# Patient Record
Sex: Male | Born: 1980 | Race: White | Hispanic: No | Marital: Single | Smoking: Never smoker
Health system: Southern US, Community
[De-identification: ages and names within clinical notes are randomized; demographics above are authoritative.]

## PROBLEM LIST (undated history)

## (undated) DIAGNOSIS — F419 Anxiety disorder, unspecified: Secondary | ICD-10-CM

---
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
## 2014-10-11 ENCOUNTER — Telehealth: Payer: Self-pay | Admitting: Internal Medicine

## 2014-10-11 NOTE — Telephone Encounter (Signed)
Received records from [facility] for appointment on 12/14/14 with Dr Hilty.  Records given to N Hines (medical records) for Dr Hilty's schedule on 12/14/14. lp °

## 2014-12-13 ENCOUNTER — Emergency Department (HOSPITAL_COMMUNITY): Payer: PRIVATE HEALTH INSURANCE

## 2014-12-13 ENCOUNTER — Encounter (HOSPITAL_COMMUNITY): Payer: Self-pay | Admitting: *Deleted

## 2014-12-13 ENCOUNTER — Emergency Department (HOSPITAL_COMMUNITY)
Admission: EM | Admit: 2014-12-13 | Discharge: 2014-12-13 | Disposition: A | Discharge status: Home / Self Care | Payer: PRIVATE HEALTH INSURANCE | Attending: Emergency Medicine | Admitting: Emergency Medicine

## 2014-12-13 DIAGNOSIS — R42 Dizziness and giddiness: Secondary | ICD-10-CM | POA: Diagnosis not present

## 2014-12-13 DIAGNOSIS — R0602 Shortness of breath: Secondary | ICD-10-CM | POA: Diagnosis not present

## 2014-12-13 DIAGNOSIS — Z79899 Other long term (current) drug therapy: Secondary | ICD-10-CM | POA: Diagnosis not present

## 2014-12-13 DIAGNOSIS — R202 Paresthesia of skin: Secondary | ICD-10-CM | POA: Diagnosis not present

## 2014-12-13 DIAGNOSIS — R079 Chest pain, unspecified: Secondary | ICD-10-CM | POA: Diagnosis present

## 2014-12-13 HISTORY — DX: Anxiety disorder, unspecified: F41.9

## 2014-12-13 LAB — CBC
HEMATOCRIT: 47.8 % (ref 39.0–52.0)
Hemoglobin: 16.4 g/dL (ref 13.0–17.0)
MCH: 28 pg (ref 26.0–34.0)
MCHC: 34.3 g/dL (ref 30.0–36.0)
MCV: 81.7 fL (ref 78.0–100.0)
Platelets: 182 10*3/uL (ref 150–400)
RBC: 5.85 MIL/uL — ABNORMAL HIGH (ref 4.22–5.81)
RDW: 13.6 % (ref 11.5–15.5)
WBC: 6.8 10*3/uL (ref 4.0–10.5)

## 2014-12-13 LAB — BASIC METABOLIC PANEL
ANION GAP: 10 (ref 5–15)
BUN: 14 mg/dL (ref 6–20)
CO2: 23 mmol/L (ref 22–32)
CREATININE: 0.95 mg/dL (ref 0.61–1.24)
Calcium: 9.4 mg/dL (ref 8.9–10.3)
Chloride: 106 mmol/L (ref 101–111)
GFR calc Af Amer: >60 mL/min (ref >60)
Glucose, Bld: 93 mg/dL (ref 65–99)
Potassium: 3.7 mmol/L (ref 3.5–5.1)
SODIUM: 139 mmol/L (ref 135–145)

## 2014-12-13 LAB — I-STAT TROPONIN, ED
TROPONIN I, POC: 0 ng/mL (ref 0.00–0.08)
TROPONIN I, POC: 0 ng/mL (ref 0.00–0.08)

## 2014-12-13 LAB — I-STAT CG4 LACTIC ACID, ED: Lactic Acid, Venous: 0.85 mmol/L (ref 0.5–2.0)

## 2014-12-13 NOTE — ED Provider Notes (Signed)
CSN: [id number]     Arrival date & time 12/13/14  1351 History   First MD Initiated Contact with Patient 12/13/14 1500     Chief Complaint  Patient presents with  . Chest Pain     (Consider location/radiation/quality/duration/timing/severity/associated sxs/prior Treatment) HPI David Costa is a 34 y.o. male  With hx of anxiety, presents to ED with complaint of chest pain. Patient states he was at work, states he was stressed out because his coworker left him to do his job as well, suddenly he started feeling dizzy, lightheaded, felt like "I was gone a pass out." This was accompanied by left-sided chest pain and tingling in both hands. Patient states chest pain lasted several minutes, however he continues to feel some dizziness. Patient states he has had multiple episodes like this in the last several weeks and was seen by his primary care doctor. States he was referred to cardiology and he has a stress test scheduled for tomorrow. Pt states he has had palpitations in the past and was told to stop all of the caffeine. He states he has not had caffeine in several months. He states his primary care doctor started him on Cardizem  "for my palpitations." Patient states he does not smoke or drink alcohol. He denies any family history of heart disease. No other complaints. 325 mg aspirin given by EMS.b CP free   Past Medical History  Diagnosis Date  . Anxiety    History reviewed. No pertinent past surgical history. No family history on file. History  Substance Use Topics  . Smoking status: Never Smoker   . Smokeless tobacco: Not on file  . Alcohol Use: No    Review of Systems  Constitutional: Negative for fever and chills.  Respiratory: Positive for chest tightness and shortness of breath. Negative for cough.   Cardiovascular: Positive for chest pain. Negative for palpitations and leg swelling.  Gastrointestinal: Negative for nausea, vomiting, abdominal pain, diarrhea and abdominal  distention.  Musculoskeletal: Negative for myalgias, arthralgias, neck pain and neck stiffness.  Skin: Negative for rash.  Allergic/Immunologic: Negative for immunocompromised state.  Neurological: Positive for dizziness and light-headedness. Negative for weakness, numbness and headaches.  All other systems reviewed and are negative.     Allergies  Prednisone  Home Medications   Prior to Admission medications   Medication Sig Start Date End Date Taking? Authorizing Provider  b complex vitamins tablet Take 1 tablet by mouth daily.   Yes Historical Provider, MD  diltiazem (CARDIZEM LA) 120 MG 24 hr tablet Take 120 mg by mouth daily.   Yes Historical Provider, MD   BP 115/78 mmHg  Pulse 69  Temp(Src) 98.2 F (36.8 C) (Oral)  Resp 20  Ht 5\' 9"  (1.753 m)  Wt 178 lb (80.74 kg)  BMI 26.27 kg/m2  SpO2 96% Physical Exam  Constitutional: He is oriented to person, place, and time. He appears well-developed and well-nourished. No distress.  HENT:  Head: Normocephalic and atraumatic.  Eyes: Conjunctivae are normal.  Neck: Neck supple.  Cardiovascular: Normal rate, regular rhythm and normal heart sounds.   Pulmonary/Chest: Effort normal. No respiratory distress. He has no wheezes. He has no rales. He exhibits no tenderness.  Abdominal: Soft. Bowel sounds are normal. He exhibits no distension. There is no tenderness. There is no rebound.  Musculoskeletal: He exhibits no edema.  Neurological: He is alert and oriented to person, place, and time.  Skin: Skin is warm and dry.  Psychiatric:  Appears anxious   Nursing  note and vitals reviewed.   ED Course  Procedures (including critical care time) Labs Review Labs Reviewed  CBC - Abnormal; Notable for the following:    RBC 5.85 (*)    All other components within normal limits  BASIC METABOLIC PANEL  Rosezena Sensor, ED    Imaging Review Dg Chest 2 View  12/13/2014   CLINICAL DATA:  Chest pain.  Shortness of breath.  Dizziness.   EXAM: CHEST  2 VIEW  COMPARISON:  06/02/2014  FINDINGS: The heart size and mediastinal contours are within normal limits. Both lungs are clear. The visualized skeletal structures are unremarkable.  IMPRESSION: Normal exam.   Electronically Signed   By: Francene Boyers M.D.   On: 12/13/2014 15:05     EKG Interpretation   Date/Time:  Monday December 13 2014 14:01:15 EDT Ventricular Rate:  69 PR Interval:  173 QRS Duration: 89 QT Interval:  367 QTC Calculation: 393 R Axis:   49 Text Interpretation:  Sinus rhythm ST elev, probable normal early repol  pattern Confirmed by Lincoln Brigham [phone]) on 12/13/2014 2:04:28 PM      MDM   Final diagnoses:  Chest pain, unspecified chest pain type  Dizziness     patient with multiple near syncopal episodes accompanied by chest pain. Last episode today at work. States he was very stressed out at time of the episode. He has a follow-up appointment with cardiology tomorrow. Will get labs, chest x-ray, will monitor.  5:42 PM Delta troponin is negative. Patient continues to be symptom free. He is not orthostatic. Vital signs are normal. His heart score is 0. His chest pain is atypical for ACS. Doubt PE, normal vital signs, PERC negative. Pt does appear anxious, wonder if this could be the cause of his symptoms. He has a close follow up with cardiology which is tomorrow. Will dc home.   Filed Vitals:   12/13/14 1411 12/13/14 1555 12/13/14 1556 12/13/14 1557  BP:  112/68 119/79 114/75  Pulse:  70 72 84  Temp:      TempSrc:      Resp:  Height:  (1.753 m)     Weight: 178 lb (80.74 kg)     SpO2:  96% 96% 98%     Jaynie Crumble, PA-C 12/13/14 1745  Mancel Bale, MD 12/15/14 1335

## 2014-12-13 NOTE — Discharge Instructions (Signed)
Everything came back reassuring today. Please follow-up with cardiology as scheduled tomorrow.   Chest Pain (Nonspecific) It is often hard to give a specific diagnosis for the cause of chest pain. There is always a chance that your pain could be related to something serious, such as a heart attack or a blood clot in the lungs. You need to follow up with your health care provider for further evaluation. CAUSES   Heartburn.  Pneumonia or bronchitis.  Anxiety or stress.  Inflammation around your heart (pericarditis) or lung (pleuritis or pleurisy).  A blood clot in the lung.  A collapsed lung (pneumothorax). It can develop suddenly on its own (spontaneous pneumothorax) or from trauma to the chest.  Shingles infection (herpes zoster virus). The chest wall is composed of bones, muscles, and cartilage. Any of these can be the source of the pain.  The bones can be bruised by injury.  The muscles or cartilage can be strained by coughing or overwork.  The cartilage can be affected by inflammation and become sore (costochondritis). DIAGNOSIS  Lab tests or other studies may be needed to find the cause of your pain. Your health care provider may have you take a test called an ambulatory electrocardiogram (ECG). An ECG records your heartbeat patterns over a 24-hour period. You may also have other tests, such as:  Transthoracic echocardiogram (TTE). During echocardiography, sound waves are used to evaluate how blood flows through your heart.  Transesophageal echocardiogram (TEE).  Cardiac monitoring. This allows your health care provider to monitor your heart rate and rhythm in real time.  Holter monitor. This is a portable device that records your heartbeat and can help diagnose heart arrhythmias. It allows your health care provider to track your heart activity for several days, if needed.  Stress tests by exercise or by giving medicine that makes the heart beat faster. TREATMENT    Treatment depends on what may be causing your chest pain. Treatment may include:  Acid blockers for heartburn.  Anti-inflammatory medicine.  Pain medicine for inflammatory conditions.  Antibiotics if an infection is present.  You may be advised to change lifestyle habits. This includes stopping smoking and avoiding alcohol, caffeine, and chocolate.  You may be advised to keep your head raised (elevated) when sleeping. This reduces the chance of acid going backward from your stomach into your esophagus. Most of the time, nonspecific chest pain will improve within 2-3 days with rest and mild pain medicine.  HOME CARE INSTRUCTIONS   If antibiotics were prescribed, take them as directed. Finish them even if you start to feel better.  For the next few days, avoid physical activities that bring on chest pain. Continue physical activities as directed.  Do not use any tobacco products, including cigarettes, chewing tobacco, or electronic cigarettes.  Avoid drinking alcohol.  Only take medicine as directed by your health care provider.  Follow your health care provider's suggestions for further testing if your chest pain does not go away.  Keep any follow-up appointments you made. If you do not go to an appointment, you could develop lasting (chronic) problems with pain. If there is any problem keeping an appointment, call to reschedule. SEEK MEDICAL CARE IF:   Your chest pain does not go away, even after treatment.  You have a rash with blisters on your chest.  You have a fever. SEEK IMMEDIATE MEDICAL CARE IF:   You have increased chest pain or pain that spreads to your arm, neck, jaw, back, or abdomen.  You have shortness of breath.  You have an increasing cough, or you cough up blood.  You have severe back or abdominal pain.  You feel nauseous or vomit.  You have severe weakness.  You faint.  You have chills. This is an emergency. Do not wait to see if the pain will  go away. Get medical help at once. Call your local emergency services (911 in [place] Do not drive yourself to the hospital. MAKE SURE YOU:   Understand these instructions.  Will watch your condition.  Will get help right away if you are not doing well or get worse. Document Released: 01/31/2005 Document Revised: 04/28/2013 Document Reviewed: 11/27/2007 [facility] Patient Information 2015 [place], [place]. This information is not intended to replace advice given to you by your health care provider. Make sure you discuss any questions you have with your health care provider.

## 2014-12-13 NOTE — ED Notes (Signed)
Pt c/o acute onset L sided chest pain, intermittent, accompanied by dizziness.  States theses episodes have been going on for several months.  In fact pt was d/t see cardiologist tomorrow, but this episode happened while he was at work, so co workers called 911.  Given 324 mg asa by ems.

## 2014-12-14 ENCOUNTER — Encounter: Payer: Self-pay | Admitting: Internal Medicine

## 2014-12-14 ENCOUNTER — Ambulatory Visit (INDEPENDENT_AMBULATORY_CARE_PROVIDER_SITE_OTHER): Payer: PRIVATE HEALTH INSURANCE | Admitting: Internal Medicine

## 2014-12-14 DIAGNOSIS — R42 Dizziness and giddiness: Secondary | ICD-10-CM

## 2014-12-14 DIAGNOSIS — G4719 Other hypersomnia: Secondary | ICD-10-CM

## 2014-12-14 DIAGNOSIS — R0681 Apnea, not elsewhere classified: Secondary | ICD-10-CM | POA: Diagnosis not present

## 2014-12-14 DIAGNOSIS — R002 Palpitations: Secondary | ICD-10-CM | POA: Diagnosis not present

## 2014-12-14 DIAGNOSIS — R0683 Snoring: Secondary | ICD-10-CM | POA: Insufficient documentation
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
## 2014-12-14 NOTE — Patient Instructions (Signed)
Your physician has recommended that you wear an event monitor for 30 days. Event monitors are medical devices that record the heart's electrical activity. Doctors most often [place] these monitors to diagnose arrhythmias. Arrhythmias are problems with the speed or rhythm of the heartbeat. The monitor is a small, portable device. You can wear one while you do your normal daily activities. This is usually used to diagnose what is causing palpitations/syncope (passing out).  >> the company [employer] will contact you and mail you a monitor  Dr. Rennis Golden has ordered an overnight oximetry - this is done thru [employer] - the company will contact your to arrange this  Your physician recommends that you schedule a follow-up appointment in 6 weeks with Dr. Rennis Golden.

## 2014-12-21 ENCOUNTER — Telehealth: Payer: Self-pay | Admitting: Internal Medicine

## 2014-12-21 NOTE — Telephone Encounter (Signed)
Spoke to the folks at [employer] and they want $800 dollars and he is wanted to let Dr. Rennis Golden know that he is not going to be able to do a heart Monitor. Please call .Marland Kitchen Thanks

## 2014-12-22 NOTE — Telephone Encounter (Signed)
Ok .. Are there any cheaper monitoring alternatives for him?  Dr. Rexene Edison

## 2014-12-22 NOTE — Telephone Encounter (Signed)
Spoke with Andee Lineman from [facility]. She reports [employer] has a pretty lenient payment plan option that patient can look into. Patient will likely have high out of pocket cost regardless of monitor type/company used d/t high deductible.   Spoke with patient and provided [employer] # [phone] and he will look into their monitor costs and let us know if he would like to proceed and if so, we can arrange monitor appointment at [facility].

## 2014-12-31 ENCOUNTER — Telehealth: Payer: Self-pay | Admitting: Internal Medicine

## 2014-12-31 NOTE — Telephone Encounter (Signed)
Advised patient to make sure [employer] was communicated w/ regarding delay in monitor wear.  Informed him that if he wears for 30 days as indicated, results from monitor may not be back until after his next scheduled OV (sept 29th)  Spoke w/ Eileen Stanford, she advised bumping back appt date by 2-3 weeks to make sure results of monitor back by time of office visit.  Informed patient we would call him to set up new appt time. He voiced understanding.

## 2014-12-31 NOTE — Telephone Encounter (Signed)
Pt called in stating that he has had the cardio event monitor for over 2 wks but due to some complications he just turned it on last night. So it is now recording his activity. Please call  Thanks

## 2015-02-01 ENCOUNTER — Ambulatory Visit (INDEPENDENT_AMBULATORY_CARE_PROVIDER_SITE_OTHER): Payer: PRIVATE HEALTH INSURANCE

## 2015-02-01 DIAGNOSIS — R002 Palpitations: Secondary | ICD-10-CM | POA: Diagnosis not present

## 2015-02-01 DIAGNOSIS — R42 Dizziness and giddiness: Secondary | ICD-10-CM | POA: Diagnosis not present

## 2015-02-03 ENCOUNTER — Ambulatory Visit: Payer: PRIVATE HEALTH INSURANCE | Admitting: Internal Medicine

## 2015-02-09 ENCOUNTER — Ambulatory Visit (INDEPENDENT_AMBULATORY_CARE_PROVIDER_SITE_OTHER): Payer: PRIVATE HEALTH INSURANCE | Admitting: Internal Medicine

## 2015-02-09 ENCOUNTER — Encounter: Payer: Self-pay | Admitting: Internal Medicine

## 2015-02-09 VITALS — BP 118/83 | HR 72 | Ht 68.0 in | Wt 181.3 lb

## 2015-02-09 DIAGNOSIS — G4734 Idiopathic sleep related nonobstructive alveolar hypoventilation: Secondary | ICD-10-CM | POA: Diagnosis not present

## 2015-02-09 DIAGNOSIS — R42 Dizziness and giddiness: Secondary | ICD-10-CM

## 2015-02-09 DIAGNOSIS — F419 Anxiety disorder, unspecified: Secondary | ICD-10-CM | POA: Insufficient documentation

## 2015-02-09 DIAGNOSIS — R002 Palpitations: Secondary | ICD-10-CM

## 2015-02-09 DIAGNOSIS — R0683 Snoring: Secondary | ICD-10-CM

## 2015-02-09 NOTE — Patient Instructions (Signed)
Your physician recommends that you schedule a follow-up appointment in: As Needed  Your physician has recommended that you have a sleep study. This test records several body functions during sleep, including: brain activity, eye movement, oxygen and carbon dioxide blood levels, heart rate and rhythm, breathing rate and rhythm, the flow of air through your mouth and nose, snoring, body muscle movements, and chest and belly movement.

## 2015-02-09 NOTE — Progress Notes (Signed)
OFFICE NOTE  Chief Complaint:  Follow-up overnight oximetry and monitor   Primary Care Physician: [place], BETTY G, MD  HPI:  David Costa is a pleasant 34 year old male is coming referred to me by Dr. Sw[place]iland for evaluation of dizziness and chest pain. In fact she was seen in the emergency department yesterday for similar symptoms. He reports getting a very sharp pain in the chest which feels like a knife. This is located laterally to the left ariola in the midaxillary line. It does not radiate and is associated with some numbness in that area. The pain persists only for a few minutes. Yesterday he was noted to be somewhat pale and diaphoretic. He was sent to the emergency department although blood pressure was normal. EKG was fairly reassuring. Troponins were negative. He has had palpitations as well and had some symptoms associated with this. They're also associated with dizziness which she was yesterday. A previous workup by ENT for possible vertigo was performed in February 2016 by Dr. Lazarus Salines, which was unrevealing. He says he's had palpitations for over 10 years. He's recently been on diltiazem for about 30 days and otherwise has done well with it except for the one episode yesterday, during which he says the palpitations were actually very minimal. He has not had prior heart monitoring. There is no history of significant coronary disease or arrhythmia in the family.  I saw David Costa back in the office today. He still struggling with paying bills from his recent emergency department visit. He seems to have had some improvement in her palpitations on Cardizem. He wore a monitor which indicated sinus tachycardia with a number of auto triggers and triggers he made 4 lightheadedness although no arrhythmias, extrasystoles or abnormal findings were noted other than sinus tachycardia. He did have the leads were reversed. He did undergo overnight oximetry as well. This demonstrated 2 minutes of  oxygen saturation less than 88% and about 5 minutes of oxygen saturation less than 89%. Awake saturations were 96%. Heart rate ranged between 53 and 103 bpm, therefore there was no significant tachycardia while he was asleep. He may have mild sleep apnea, but does not have a formal sleep study due to insurance reasons.  PMHx:  Past Medical History  Diagnosis Date  . Anxiety     No past surgical history on file.  FAMHx:  Family History  Problem Relation Age of Onset  . Heart murmur Mother   . Heart murmur Father   . Hypertension Father   . Stroke Maternal Grandfather     SOCHx:   reports that he has never smoked. He does not have any smokeless tobacco history on file. He reports that he does not drink alcohol or use illicit drugs.  ALLERGIES:  Allergies  Allergen Reactions  . Fish Allergy   . Prednisone Swelling  . Shellfish Allergy     ROS: A comprehensive review of systems was negative except for: Constitutional: positive for fatigue Cardiovascular: positive for irregular heart beat and palpitations Neurological: positive for dizziness  HOME MEDS: Current Outpatient Prescriptions  Medication Sig Dispense Refill  . b complex vitamins tablet Take 1 tablet by mouth daily.    Marland Kitchen diltiazem (CARDIZEM LA) 120 MG 24 hr tablet Take 120 mg by mouth daily.     No current facility-administered medications for this visit.    LABS/IMAGING: No results found for this or any previous visit (from the past 48 hour(s)). No results found.  WEIGHTS: Wt Readings from Last  3 Encounters:  02/09/15 181 lb 4.8 oz (82.237 kg)  12/14/14 174 lb 4.8 oz (79.062 kg)  12/13/14 178 lb (80.74 kg)    VITALS: BP 118/83 mmHg  Pulse 72  Ht  (1.727 m)  Wt 181 lb 4.8 oz (82.237 kg)  BMI 27.57 kg/m2  EXAM: Deferred  EKG: Deferred  ASSESSMENT: 1. Palpitations - recurrent sinus tachycardia associated with dizziness on his EKG, no pathologic findings  2. Atypical sharp chest pain-likely  neuropathic 3. Fatigue and nonrestorative sleep-EPWSS of 17, concerning for possible sleep apnea - Nocturnal hypoxia on overnight oximetry  4. Anxiety  PLAN: 1.   David Costa continues to have some palpitations although reports there are improved on diltiazem. He had hypoxia on his overnight oximetry study and should have a formal sleep study. He does not feel that he can afford that right now and we'll go ahead and order it once his insurance provides better coverage. I do think this is significant anxiety component which may need to be addressed by his primary care provider. He did have an episode last Sunday where he felt faint and dizzy and then had a sharp chest pain which lasted for just a few seconds. This does not sound coronary in nature.   Plan to see him back on an as-needed basis.   Chrystie Nose, MD, [facility] Attending Cardiologist [facility]  Chrystie Nose 02/09/2015, 9:38 AM

## 2016-05-30 ENCOUNTER — Ambulatory Visit (INDEPENDENT_AMBULATORY_CARE_PROVIDER_SITE_OTHER): Payer: Worker's Compensation

## 2016-05-30 ENCOUNTER — Ambulatory Visit (INDEPENDENT_AMBULATORY_CARE_PROVIDER_SITE_OTHER): Payer: Worker's Compensation | Admitting: Family Medicine

## 2016-05-30 VITALS — BP 134/92 | HR 80 | Temp 98.6°F | Resp 16 | Ht 67.0 in | Wt 191.8 lb

## 2016-05-30 DIAGNOSIS — M545 Low back pain, unspecified: Secondary | ICD-10-CM

## 2016-05-30 DIAGNOSIS — M546 Pain in thoracic spine: Secondary | ICD-10-CM

## 2016-05-30 MED ORDER — IBUPROFEN 600 MG PO TABS: 600.0000 mg | ORAL_TABLET | Freq: Three times a day (TID) | ORAL | 0 refills | Status: AC | PRN

## 2016-05-30 MED ORDER — CYCLOBENZAPRINE HCL 10 MG PO TABS: 10.0000 mg | ORAL_TABLET | Freq: Three times a day (TID) | ORAL | 0 refills | Status: AC | PRN

## 2016-05-30 NOTE — Progress Notes (Signed)
David Costa [birth year] 36 y.o.   Chief Complaint  Patient presents with  . Back Pain    Low / mid.  back pain from a fall x this morning    Presents for evaluation of work-related complaint.  Date of Injury: 05/30/16 (earlier today)  History of Present Illness: Patient works for the [employer]. He is a Hospital doctor. He was at work this morning around 4 AM he was offloading carts from his truck. He states that the lift fell out from under him and he jumped back and landed on his right side in the back of his truck. He does not have any pain from this.   However he then realized that most the fall of the lift spilled most of his letters from his cart and he stepped into the cart to start putting it back together. He states this is a slight middle surface and his feet went out from under him and he landed directly on the middle of his back. He had pretty immediate pain. He took about an hour and a half due to the pain to crawl around and pick up the rest of the letters. He finished his shift at 7:30 this morning but had fairly noticeable pain throughout that time.  Was not able to get an appt to be seen until noon today.   He describes it as a dull aching pain similar sharp stabbing left lower back. No pain ML of his back even though he landed here.  No radiation to his buttocks. No radiation to lower extremities. No paresthesias or numbness. No lower extremity weakness. He has had normal urination since then. Has not had a bowel movement but has no incontinence.  He does have history of "low back problems in the past." He didn't treated conservatively with chiropractor. However his back pain in the past doesn't feel like the pain currently.   ROS  Review of Systems  Constitutional: Negative for fever.  HENT: Negative for congestion, ear discharge, ear pain and hearing loss.   Eyes: Negative for blurred vision.  Respiratory: Negative for cough and wheezing.   Cardiovascular: Negative  for chest pain, palpitations and leg swelling.  Gastrointestinal: Negative for nausea, vomiting and abdominal pain.  Genitourinary: Negative for dysuria, hematuria and flank pain.  Musculoskeletal: Negative for neck pain.  Skin: Negative for rash.  Neurological: Negative for dizziness and headaches.  No LE paresthesias.  Psychiatric/Behavioral: Negative for depression and suicidal ideas.    Physical Exam BP (!) 134/92 (BP Location: Right Arm, Patient Position: Sitting, Cuff Size: Large)   Pulse 80   Temp 98.6 F (37 C) (Oral)   Resp 16   Ht 5\' 7"  (1.702 m)   Wt 191 lb 12.8 oz (87 kg)   SpO2 94%   BMI 30.04 kg/m  Gen:  Alert, cooperative patient who appears stated age in no acute distress.  Vital signs reviewed. Head: [place]/AT.   Eyes:  EOMI, PERRL.   Ears:  External ears WNL, Bilateral TM's normal without retraction, redness or bulging. Nose:  Septum midline  Mouth:  MMM, tonsils non-erythematous, non-edematous.   Neck: No masses or thyromegaly or limitation in range of motion.  No cervical lymphadenopathy. Pulm:  Clear to auscultation bilaterally with good air movement.  No wheezes or rales noted.   Cardiac:  Regular rate and rhythm without murmur auscultated.  Good S1/S2. Back:  Normal skin, Spine with normal alignment and no deformity.  No tenderness to vertebral process  palpation.  Paraspinous muscles are tender and with spasm in Left lumbar region.   Range of motion is full at neck and decreased forward flexion lumbosacral region.  Straight leg raise is positive for Left sided back pain. Neuro:  Sensation and motor function 5/5 bilateral lower extremities.  Patellar DTR's +3 on Left and +2 on Right.  He is able to walk on his heels and toes without difficulty.  Ext:  No clubbing/cyanosis/erythema.  No edema noted bilateral lower extremities.   Neuro:  Grossly normal, no gait abnormalities Psych:  Not depressed or anxious appearing.  Conversant and engaged   Assessment and  Plan: 1.  Lumbosacral back pain - back spasm Out of work for next 7 days to ensure he improved before going back to work as work involves lots of bending over and lifting heavy things.     Plan of rest, intermittent application of cold packs (later, may switch to heat, but do not sleep on heating pad), analgesics and muscle relaxants. NSAIDs plus Flexeril as symptomatic relief.  Consider Physical Therapy and XRay studies if not improving.  Call or return to clinic prn if these symptoms worsen or fail to improve as anticipated.  Return immediately if worsening.

## 2016-05-30 NOTE — Patient Instructions (Addendum)
It was good to see you today.  Your back bones look good - there is no evidence of fracture.   You have a bad muscle spasm in your back.  This is causing the pain you are feeling.  Take the Ibuprofen 600 mg every 8 hours or so for pain relief and anti-inflammatory effects.  Take the Flexeril as a muscle relaxer at night. This may make you drowsy and if it does do not take it during the day.  Ice today.  After that, heat and massage are also great to help relieve the pain.  This can last for the next 7-10 days. If you're still having issues in the next 2 weeks come back and see us.  If you start having worsening pain despite the treatment don't wait and come back immediately.  Because this can linger for so long, it's best to wait a week to go back to work.  You should be able to go back to work on Thursday, 06/07/16.       IF you received an x-ray today, you will receive an invoice from [facility]. Please contact [facility] at [phone] with questions or concerns regarding your invoice.   IF you received labwork today, you will receive an invoice from [facility]. Please contact [facility] at [phone] with questions or concerns regarding your invoice.   Our billing staff will not be able to assist you with questions regarding bills from these companies.  You will be contacted with the lab results as soon as they are available. The fastest way to get your results is to activate your My Chart account. Instructions are located on the last page of this paperwork. If you have not heard from us regarding the results in 2 weeks, please contact this office.

## 2016-06-04 ENCOUNTER — Telehealth: Payer: Self-pay

## 2016-06-04 NOTE — Telephone Encounter (Signed)
Please call this patient. Dr. Tyson AliasWalden's note indicates that he should be able to RTW on Thursday 06/07/2016. Generally, re-evaluation is required for RTW letter. If he desires RTW sooner than originally recommended, he needs re-evaluation.

## 2016-06-04 NOTE — Telephone Encounter (Signed)
Left message on pt's voicemail. Pt advised that the note written by Dr. Gwendolyn GrantWalden indicated his RTW on 06/07/16 and that, if he needs/wants to return to work sooner and needs a note that states that, he will need to be seen again.

## 2016-06-04 NOTE — Telephone Encounter (Signed)
PATIENT STATES HE SAW DR. Gwendolyn GrantWALDEN LAST Wednesday FOR A BACK INJURY HE RECEIVED AT WORK. HE NEEDS TO GET A WORK NOTE FROM DR. Gwendolyn GrantWALDEN STATING HE CAN RETURN ON Tuesday 06/05/2016. PLEASE CALL HIM WHEN HE CAN PICK THE NOTE UP. BEST PHONE [phone] (CELL) MBC

## 2016-06-06 ENCOUNTER — Ambulatory Visit (INDEPENDENT_AMBULATORY_CARE_PROVIDER_SITE_OTHER): Payer: Worker's Compensation | Admitting: Family Medicine

## 2016-06-06 VITALS — BP 126/82 | HR 80 | Temp 98.3°F | Resp 18 | Ht 67.0 in | Wt 191.0 lb

## 2016-06-06 DIAGNOSIS — M545 Low back pain: Secondary | ICD-10-CM | POA: Diagnosis not present

## 2016-06-06 NOTE — Patient Instructions (Addendum)
It was good to see you again today  Your back is doing well.  You will be sore after going back to work, taking an Ibuprofen before work will make you less sore.   You are cleared to go back to work.  If you start having worsening pain, come back and see us.      IF you received an x-ray today, you will receive an invoice from [facility]. Please contact [facility] at [phone] with questions or concerns regarding your invoice.   IF you received labwork today, you will receive an invoice from [facility]. Please contact [facility] at [phone] with questions or concerns regarding your invoice.   Our billing staff will not be able to assist you with questions regarding bills from these companies.  You will be contacted with the lab results as soon as they are available. The fastest way to get your results is to activate your My Chart account. Instructions are located on the last page of this paperwork. If you have not heard from us regarding the results in 2 weeks, please contact this office.

## 2016-06-06 NOTE — Progress Notes (Signed)
David Costa May 19, 1980 36 y.o.   Chief Complaint  Patient presents with  . Follow-up    W/C     Presents for evaluation of work-related complaint.  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 On 05/30/2016 he slipped and fell and hurt his back. He was seen here on January 24. He was diagnosed with midline thoracic back sprain. He had negative radiographs of his back. He was prescribed Flexeril and ibuprofen. He has been taking these and doing ice with some relief. He states as of past several days his pain has completely resolved. He is not needing any pain medicine. He has no further back pain and has had no pain that radiates down his legs.  He has been fairly active at home such as walking his dog and gently making sure his back is stiff.  He wants to go back to work but needs a return to work note. He's had no further injury since last visit.  As noted in the past he has had a history of low back pain and this was treated conservatively with chiropractor.  ROS  ROS  Review of Systems  Constitutional: Negative for fever.  HENT: Negative for congestion, ear discharge, ear pain and hearing loss.  Eyes: Negative for blurred vision.  Respiratory: Negative for cough and wheezing.  Cardiovascular: Negative for chest pain, palpitations and leg swelling.  Gastrointestinal: Negative for nausea, vomiting and abdominal pain.  Genitourinary: Negative for dysuria, hematuria and flank pain.  Musculoskeletal: Negative for neck pain. Negative for back pain Skin: Negative for rash.  Neurological: Negative for dizziness and headaches.  No LE paresthesias.  Psychiatric/Behavioral: Negative for depression and suicidal ideas.    Current medications and allergies reviewed and updated. Past medical history, family history, social history have been reviewed and updated.   Physical Exam  BP 126/82   Pulse 80   Temp 98.3 F (36.8 C) (Oral)    Resp 18   Ht 5\' 7"  (1.702 m)   Wt 191 lb (86.6 kg)   SpO2 93%   BMI 29.91 kg/m  Gen:  Alert, cooperative patient who appears stated age in no acute distress.  Vital signs reviewed. HEENT:  [place]/AT.  EOMI, PERRL.  MMM,  Neck: No masses or thyromegaly or limitation in range of motion.  No cervical lymphadenopathy. Ext:  No clubbing/cyanosis/erythema.  No edema noted bilateral lower extremities.   Back:  Normal skin, Spine with normal alignment and no deformity.  No tenderness to vertebral process palpation.  Paraspinous muscles are not tender and without spasm.   Range of motion is full at neck and decreased forward flexion only slightly to lumbar sacral regions.  Straight leg raise is negative Neuro:  Sensation and motor function 5/5 bilateral lower extremities.  He is able to walk on his heels and toes without difficulty. Neuro:  Grossly normal, no gait abnormalities.  Full sensation BL lower extremities.   Psych:  Not depressed or anxious appearing.  Conversant and engaged   Assessment and Plan: 1.  Back pain s/p lumbar muscle sprain: - Pain is completely resolved. -I did discuss with him he will likely be sore after returning back to work. He should take a prophylactic ibuprofen before going in. 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 -Follow-up with us is any further pain or worsening of his symptoms. -I have  written him a return to work note for later today.

## 2019-05-18 ENCOUNTER — Ambulatory Visit: Payer: Self-pay

## 2019-05-18 ENCOUNTER — Other Ambulatory Visit: Payer: Self-pay

## 2019-05-18 ENCOUNTER — Other Ambulatory Visit: Payer: Self-pay | Admitting: Family Medicine

## 2019-05-18 DIAGNOSIS — M25572 Pain in left ankle and joints of left foot: Secondary | ICD-10-CM

## 2019-05-18 DIAGNOSIS — M79672 Pain in left foot: Secondary | ICD-10-CM

## 2020-07-16 IMAGING — DX DG FOOT COMPLETE 3+V*L*
3 series · 3 of 3 positions shown · non-contrast
Comparison: None.

CLINICAL DATA: Injury laterally

EXAM:
LEFT FOOT - COMPLETE 3+ VIEW

[foot ap]
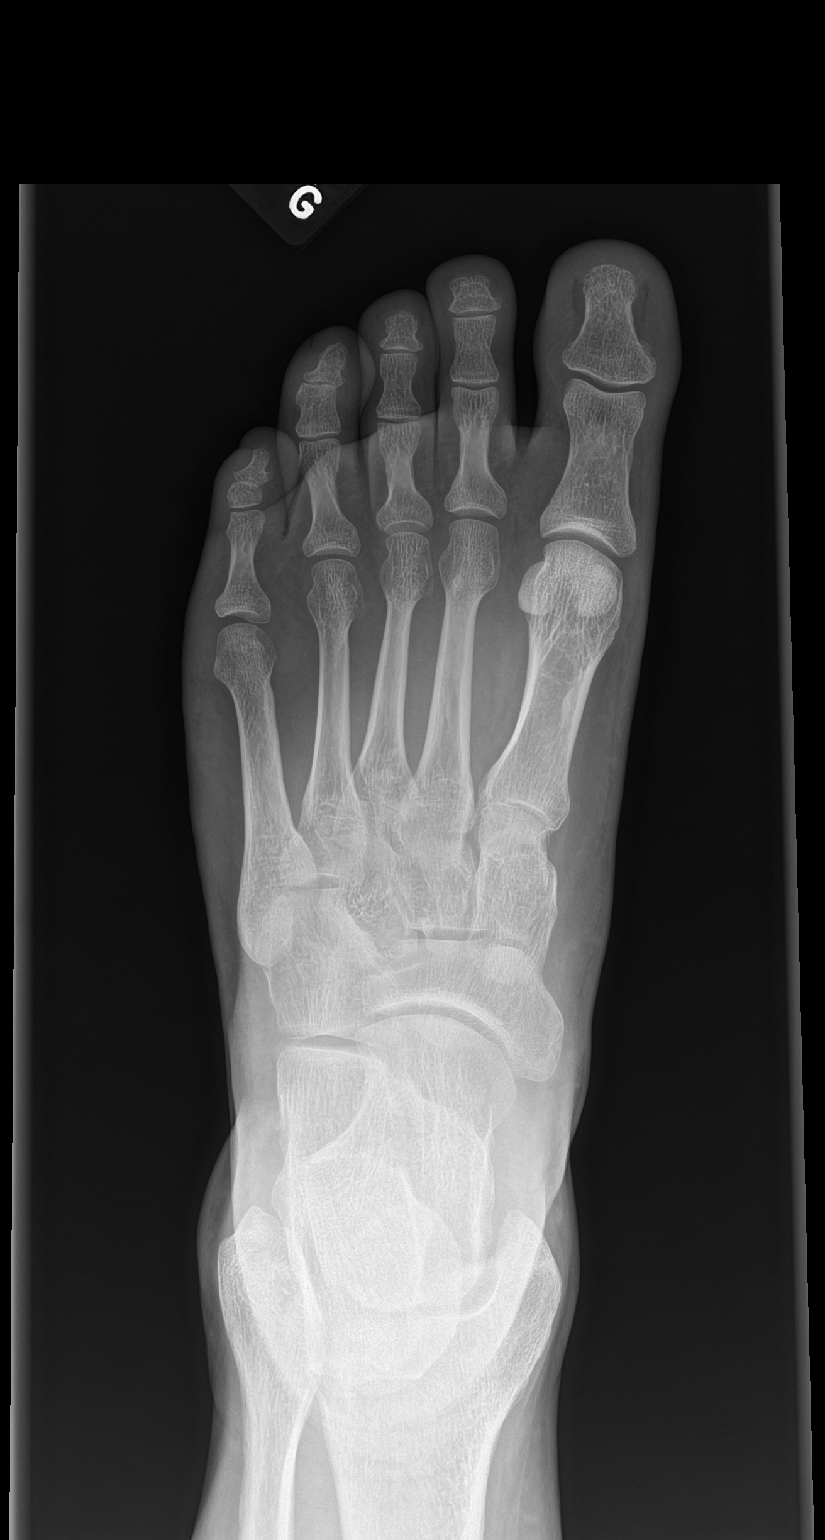

[foot obl]
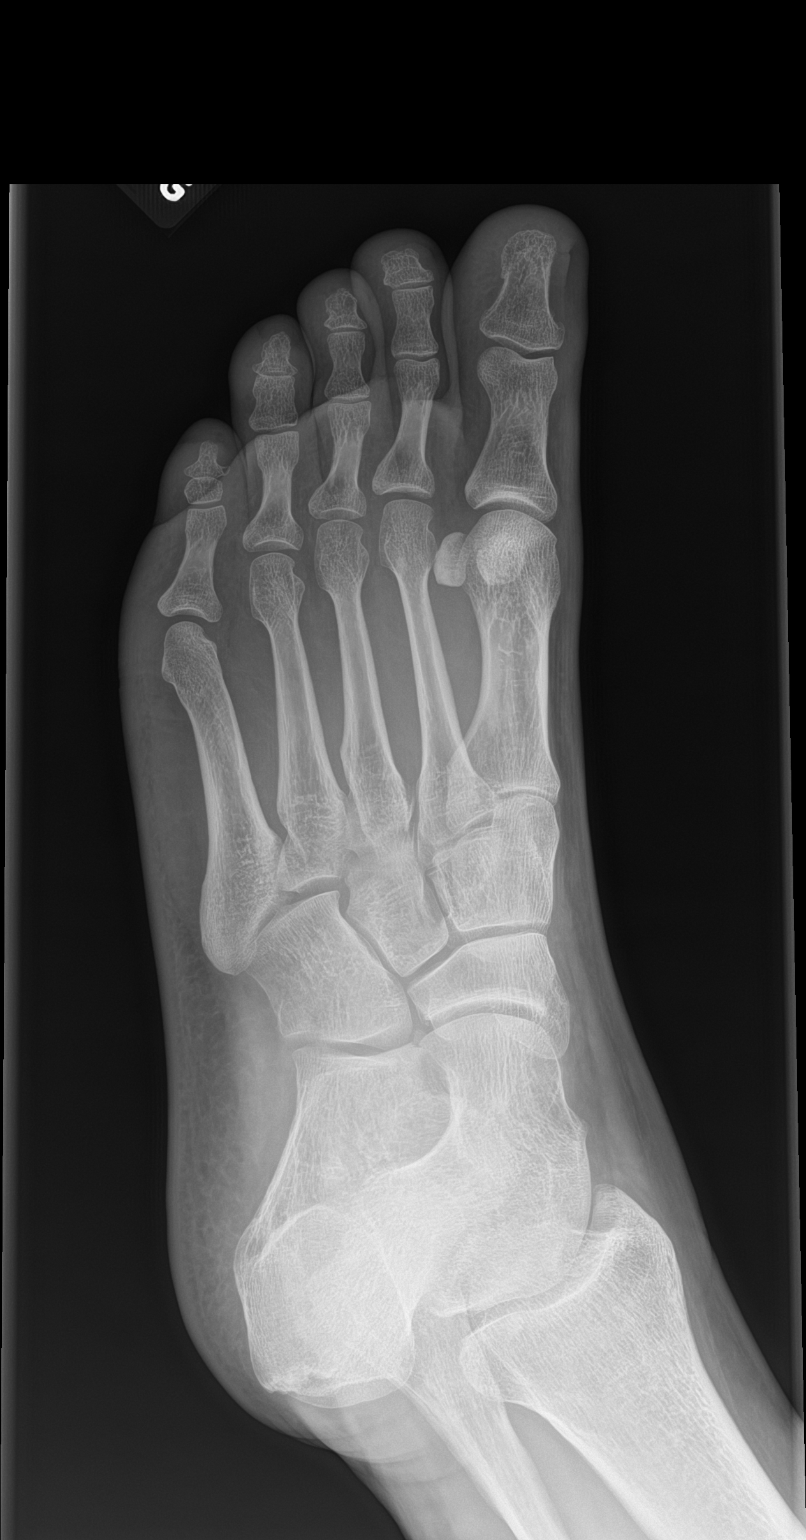

[foot lat]
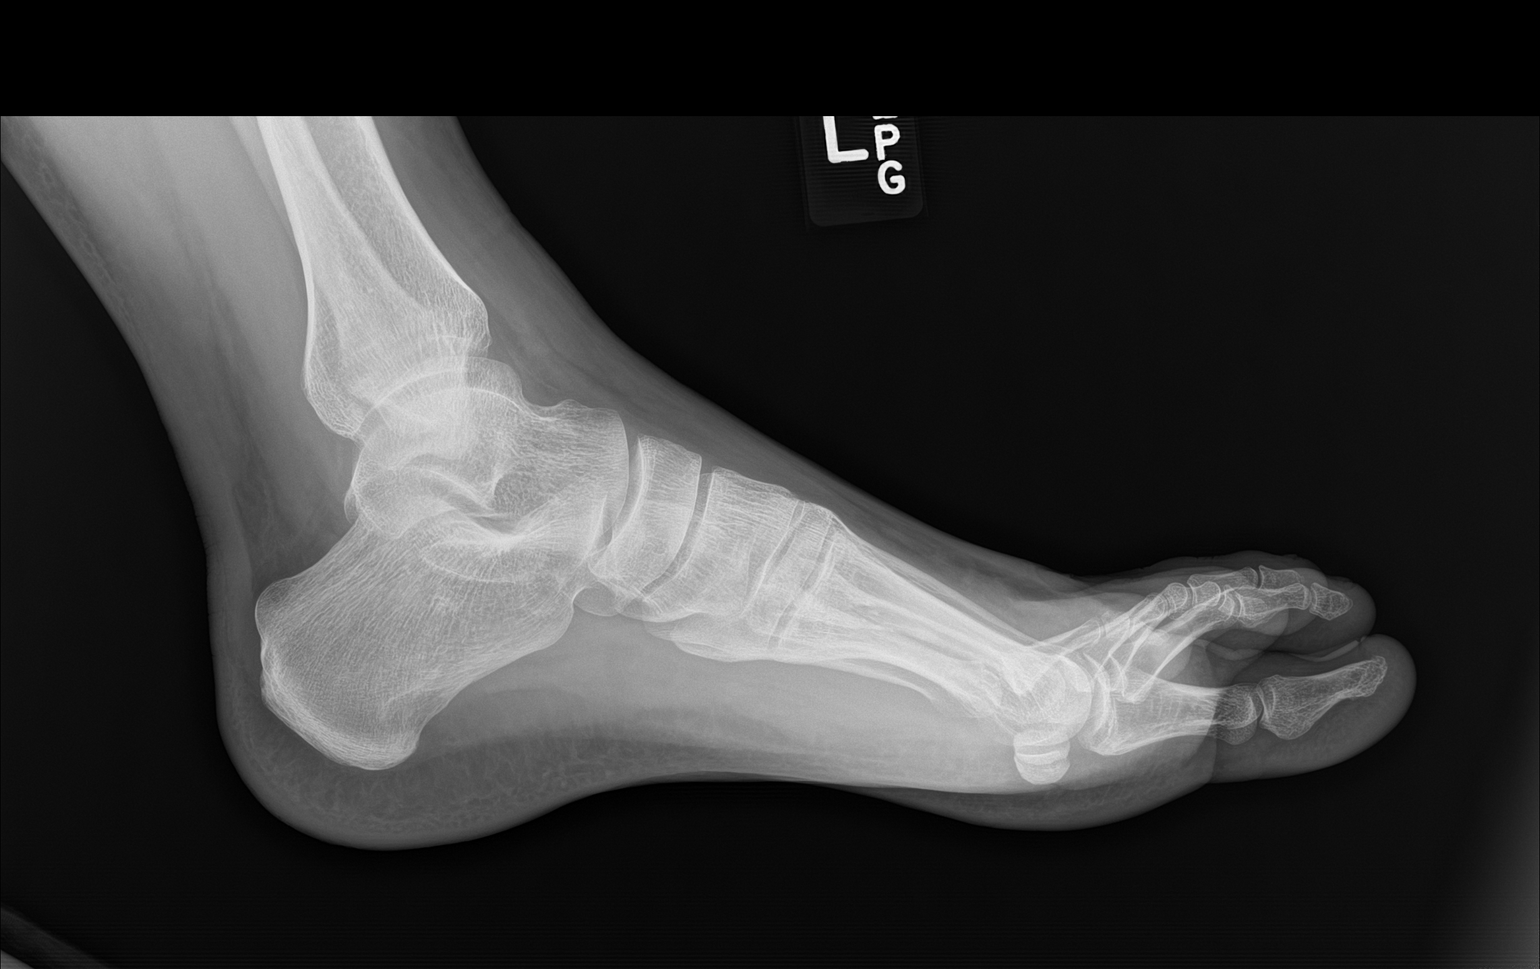

[3 of 3 positions shown; findings below may reference images not displayed]

FINDINGS: Frontal, oblique, and lateral views were obtained. No fracture or
dislocation. Joint spaces appear normal. No erosive change.
IMPRESSION: No fracture or dislocation.  No evident arthropathy.

## 2020-07-16 IMAGING — DX DG ANKLE COMPLETE 3+V*L*
3 series · 3 of 3 positions shown · non-contrast
Comparison: None.

CLINICAL DATA: Injury laterally with pain

EXAM:
LEFT ANKLE COMPLETE - 3+ VIEW

[ankle ap]
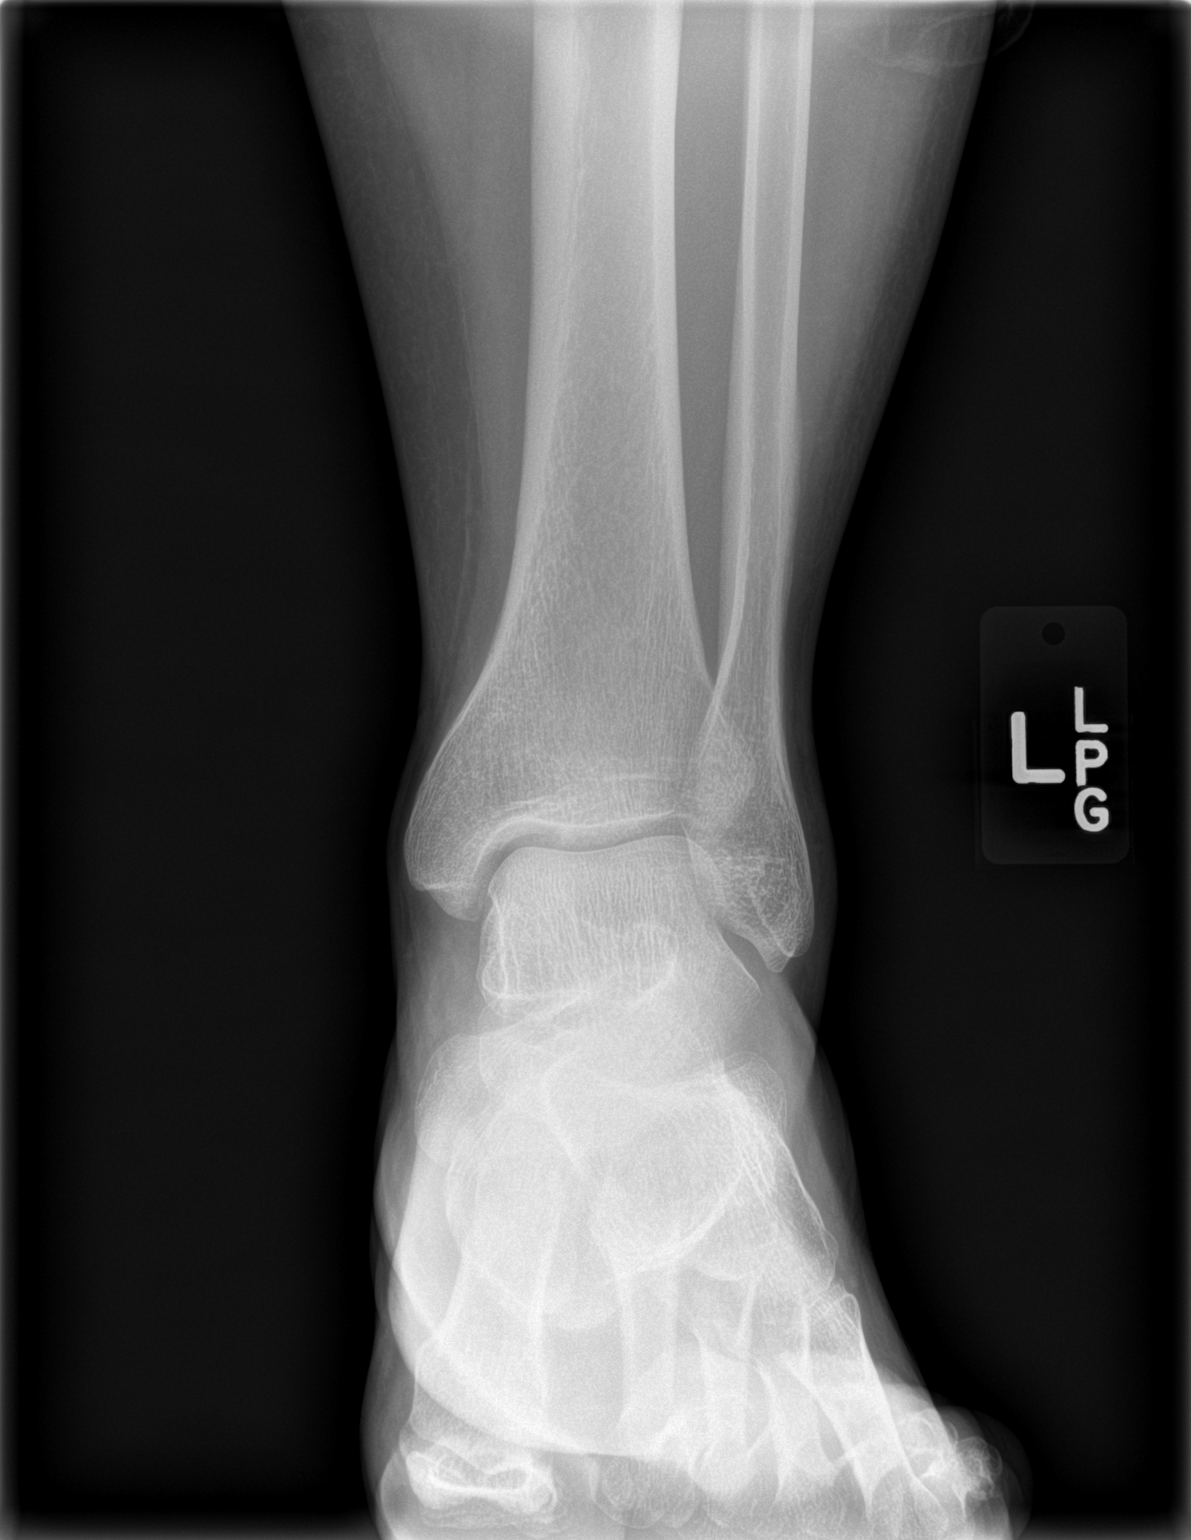

[ankle mortise]
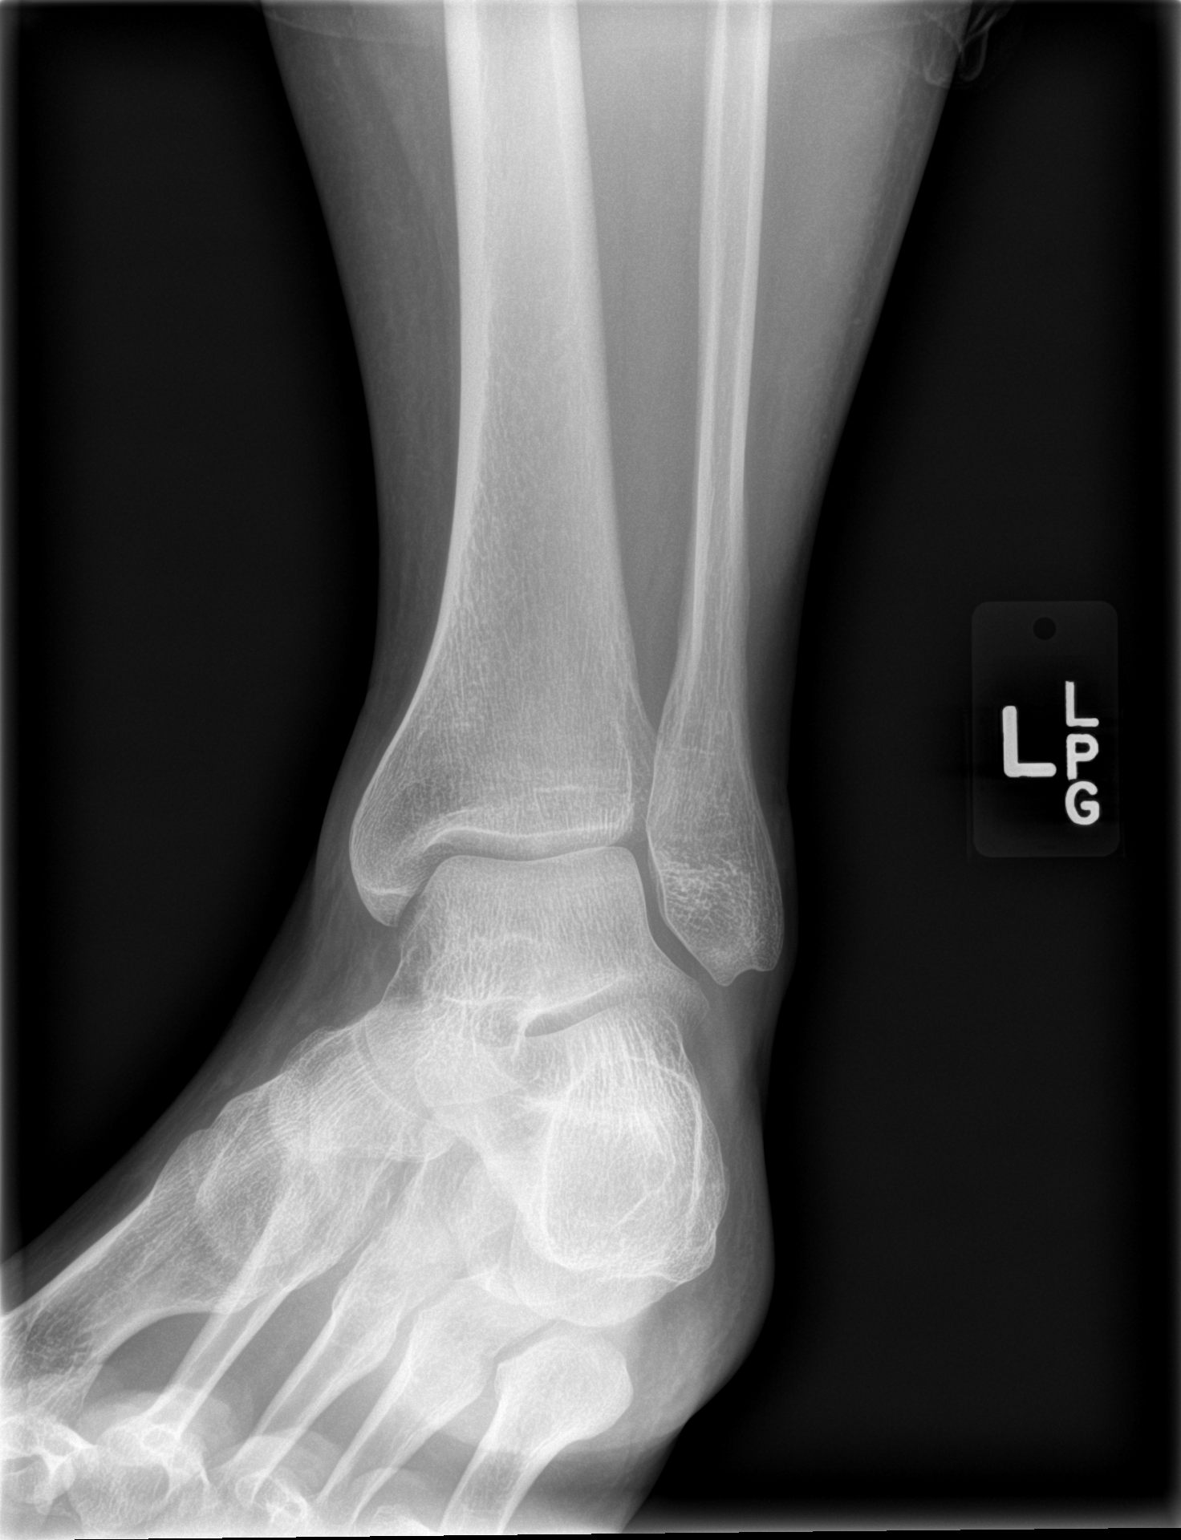

[ankle lat]
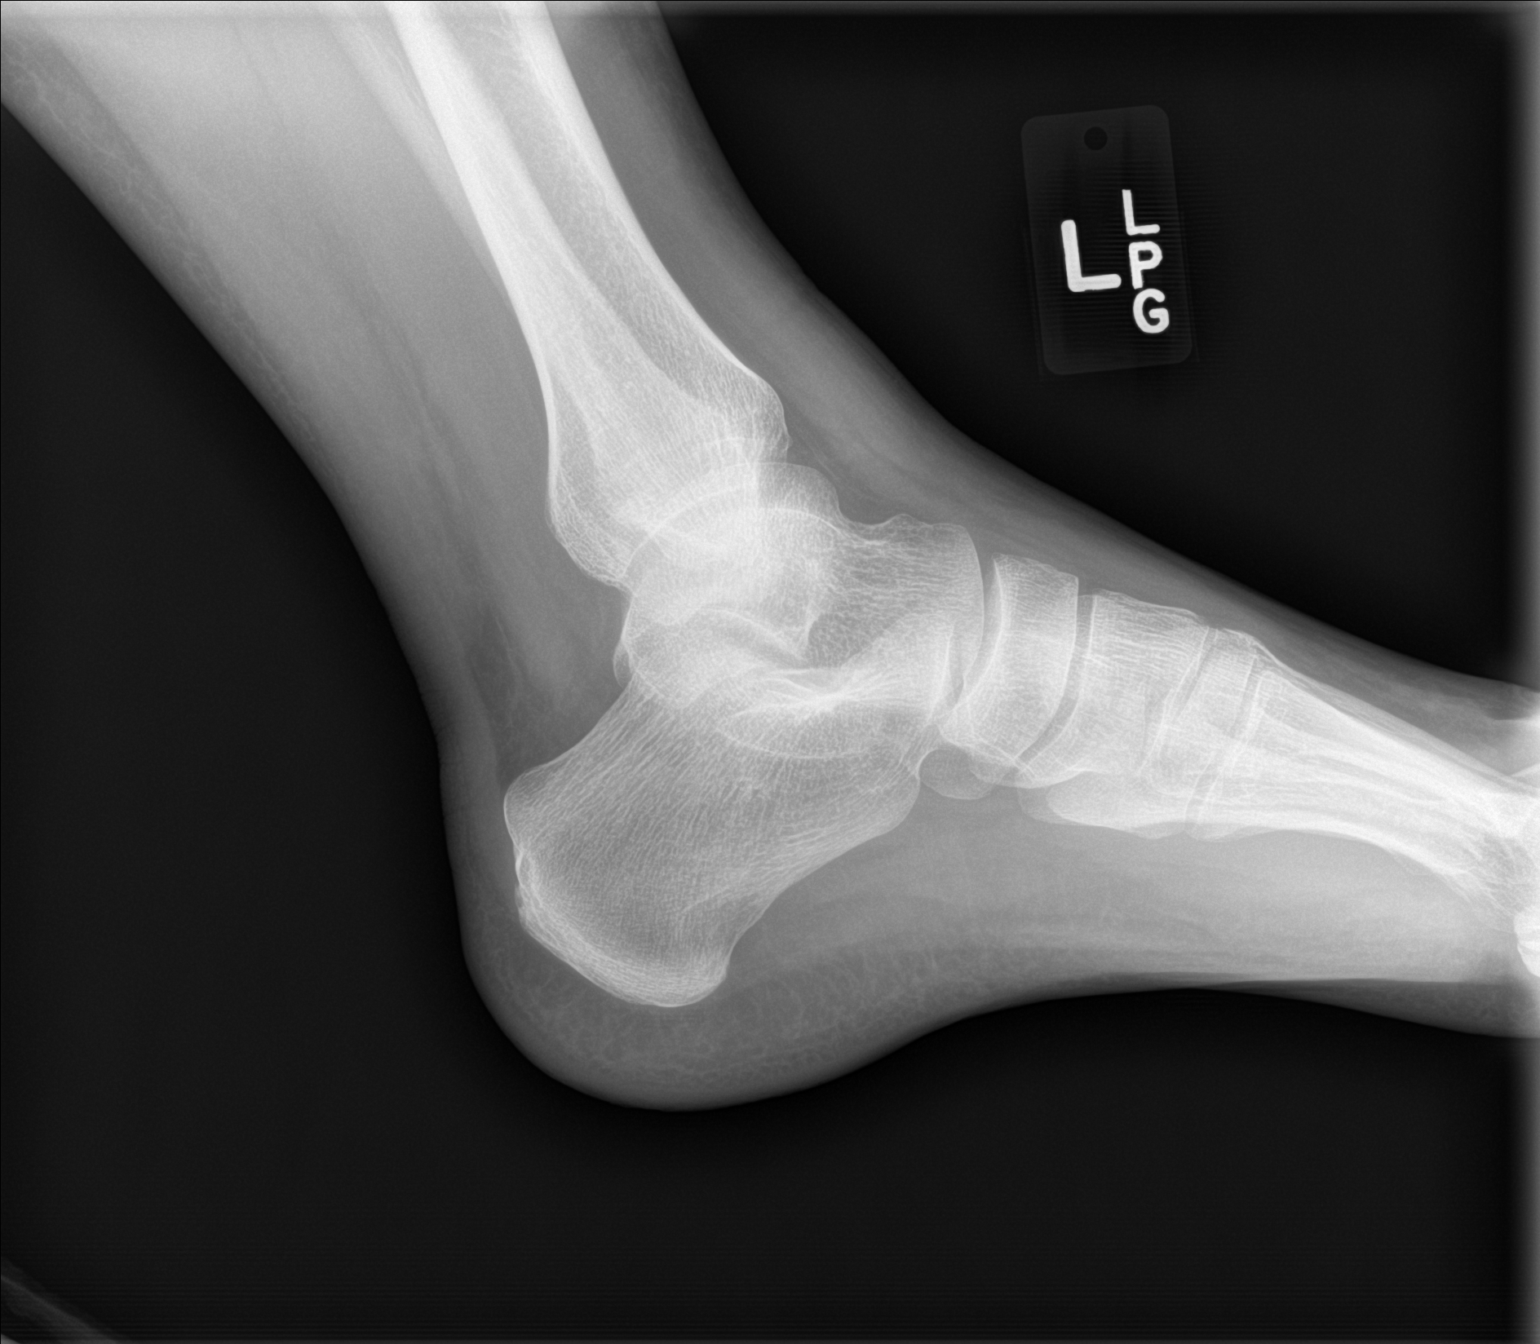

[3 of 3 positions shown; findings below may reference images not displayed]

FINDINGS: Frontal, oblique, and lateral views were obtained. There is no
evident fracture or joint effusion. There is no joint space
narrowing or erosion. The ankle mortise appears intact.
IMPRESSION: No evident fracture or appreciable arthropathy. Ankle mortise
appears intact.
